# Patient Record
Sex: Male | Born: 2000 | Race: Black or African American | Hispanic: No | Marital: Single | Smoking: Never smoker
Health system: Southern US, Community
[De-identification: ages and names within clinical notes are randomized; demographics above are authoritative.]

---
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
## 2020-04-06 ENCOUNTER — Other Ambulatory Visit: Payer: Medicaid Other

## 2020-04-06 DIAGNOSIS — Z20822 Contact with and (suspected) exposure to covid-19: Secondary | ICD-10-CM

## 2020-04-07 LAB — NOVEL CORONAVIRUS, NAA: SARS-CoV-2, NAA: NOT DETECTED

## 2020-04-07 LAB — SARS-COV-2, NAA 2 DAY TAT

## 2021-06-26 ENCOUNTER — Emergency Department
Admission: EM | Admit: 2021-06-26 | Discharge: 2021-06-27 | Disposition: A | Discharge status: Home / Self Care | Payer: BLUE CROSS/BLUE SHIELD | Attending: Emergency Medicine | Admitting: Emergency Medicine

## 2021-06-26 ENCOUNTER — Emergency Department: Payer: BLUE CROSS/BLUE SHIELD

## 2021-06-26 ENCOUNTER — Other Ambulatory Visit: Payer: Self-pay

## 2021-06-26 DIAGNOSIS — R55 Syncope and collapse: Secondary | ICD-10-CM | POA: Diagnosis not present

## 2021-06-26 DIAGNOSIS — U071 COVID-19: Secondary | ICD-10-CM | POA: Diagnosis not present

## 2021-06-26 DIAGNOSIS — J029 Acute pharyngitis, unspecified: Secondary | ICD-10-CM | POA: Diagnosis present

## 2021-06-26 LAB — CBC
HCT: 45.8 % (ref 39.0–52.0)
Hemoglobin: 16.5 g/dL (ref 13.0–17.0)
MCH: 29.9 pg (ref 26.0–34.0)
MCHC: 36 g/dL (ref 30.0–36.0)
MCV: 83 fL (ref 80.0–100.0)
Platelets: 252 10*3/uL (ref 150–400)
RBC: 5.52 MIL/uL (ref 4.22–5.81)
RDW: 12.2 % (ref 11.5–15.5)
WBC: 7.8 10*3/uL (ref 4.0–10.5)
nRBC: 0 % (ref 0.0–0.2)

## 2021-06-26 LAB — BASIC METABOLIC PANEL
Anion gap: 11 (ref 5–15)
BUN: 14 mg/dL (ref 6–20)
CO2: 21 mmol/L — ABNORMAL LOW (ref 22–32)
Calcium: 9.2 mg/dL (ref 8.9–10.3)
Chloride: 104 mmol/L (ref 98–111)
Creatinine, Ser: 1.05 mg/dL (ref 0.61–1.24)
GFR, Estimated: >60 mL/min (ref >60)
Glucose, Bld: 116 mg/dL — ABNORMAL HIGH (ref 70–99)
Potassium: 3.9 mmol/L (ref 3.5–5.1)
Sodium: 136 mmol/L (ref 135–145)

## 2021-06-26 LAB — URINALYSIS, ROUTINE W REFLEX MICROSCOPIC
Bilirubin Urine: NEGATIVE
Glucose, UA: NEGATIVE mg/dL
Hgb urine dipstick: NEGATIVE
Ketones, ur: NEGATIVE mg/dL
Leukocytes,Ua: NEGATIVE
Nitrite: NEGATIVE
Protein, ur: NEGATIVE mg/dL
Specific Gravity, Urine: 1.008 (ref 1.005–1.030)
pH: 8 (ref 5.0–8.0)

## 2021-06-26 LAB — RESP PANEL BY RT-PCR (FLU A&B, COVID) ARPGX2
Influenza A by PCR: NEGATIVE
Influenza B by PCR: NEGATIVE
SARS Coronavirus 2 by RT PCR: POSITIVE — AB

## 2021-06-26 LAB — TROPONIN I (HIGH SENSITIVITY)
Troponin I (High Sensitivity): 5 ng/L (ref <18)
Troponin I (High Sensitivity): 6 ng/L (ref <18)

## 2021-06-26 MED ORDER — ONDANSETRON HCL 4 MG/2ML IJ SOLN
4.0000 mg | Freq: Once | INTRAMUSCULAR | Status: AC
  Administered 2021-06-26: 4 mg via INTRAVENOUS
  Filled 2021-06-26: qty 2

## 2021-06-26 MED ORDER — KETOROLAC TROMETHAMINE 30 MG/ML IJ SOLN
15.0000 mg | Freq: Once | INTRAMUSCULAR | Status: AC
  Administered 2021-06-26: 15 mg via INTRAVENOUS
  Filled 2021-06-26: qty 1

## 2021-06-26 MED ORDER — ACETAMINOPHEN 500 MG PO TABS
1000.0000 mg | ORAL_TABLET | Freq: Once | ORAL | Status: AC
  Administered 2021-06-26: 1000 mg via ORAL
  Filled 2021-06-26: qty 2

## 2021-06-26 MED ORDER — SODIUM CHLORIDE 0.9 % IV BOLUS
1000.0000 mL | Freq: Once | INTRAVENOUS | Status: AC
  Administered 2021-06-26: 1000 mL via INTRAVENOUS

## 2021-06-26 MED ORDER — NIRMATRELVIR/RITONAVIR (PAXLOVID)TABLET
3.0000 | ORAL_TABLET | Freq: Two times a day (BID) | ORAL | 0 refills | Status: AC
Start: 2021-06-26 — End: 2021-07-01

## 2021-06-26 NOTE — ED Provider Notes (Signed)
[facility] Provider Note    Event Date/Time   First MD Initiated Contact with Patient 06/26/21 1957     (approximate)  History   Chief Complaint: Loss of Consciousness  HPI  Dennis Hodges is a 21 y.o. male with no significant past medical history who presents to the emergency department after syncopal episode.  According to the patient and his mother the patient was performing at a church and in between sets states felt lightheaded and was found on the ground unconscious.  Patient does state he has had a sore throat and runny nose that started this morning.  No known fever.  99.5 in the emergency department.  Currently patient is feeling chills, headache.  Patient has a stuttering speech pattern which mom states is not normal.  Physical Exam   Triage Vital Signs: ED Triage Vitals  Enc Vitals Group     BP 06/26/21 1936 125/77     Pulse Rate 06/26/21 1936 (!) 110     Resp 06/26/21 1936 (!) 22     Temp 06/26/21 1936 99.5 F (37.5 C)     Temp Source 06/26/21 1936 Axillary     SpO2 06/26/21 1936 94 %     Weight 06/26/21 1937 250 lb (113.4 kg)     Height 06/26/21 1937 5\' 9"  (1.753 m)     Head Circumference --      Peak Flow --      Pain Score 06/26/21 1937 0     Pain Loc --      Pain Edu? --      Excl. in GC? --     Most recent vital signs: Vitals:   06/26/21 1936  BP: 125/77  Pulse: (!) 110  Resp: (!) 22  Temp: 99.5 F (37.5 C)  SpO2: 94%    General: Awake, no distress.  CV:  Good peripheral perfusion.  Regular rate and rhythm  Resp:  Normal effort.  Equal breath sounds bilaterally.  No wheeze rales or rhonchi. Abd:  No distention.  Soft, nontender.  No rebound or guarding. Other:  Somewhat of a stuttering speech pattern.  Scleral injection bilaterally.   ED Results / Procedures / Treatments   EKG  EKG viewed and interpreted by myself shows sinus tachycardia 112 bpm with a narrow QRS, normal axis, normal intervals, no concerning ST  changes.  RADIOLOGY  I have reviewed the CT images, no acute abnormality my evaluation. CT is read as negative.   MEDICATIONS ORDERED IN ED: Medications - No data to display   IMPRESSION / MDM / ASSESSMENT AND PLAN / ED COURSE  I reviewed the triage vital signs and the nursing notes.  Patient presents to the emergency department after syncopal episode at church.  Patient does have somewhat of a stuttering speech pattern which mom states is atypical.  He also is complaining of a sore throat runny nose since this morning, will 99.5 temp in the emergency department somewhat tachycardic.  Concern for infectious illness such as COVID or influenza we will check a swab.  We will check labs given the syncopal episode and headache we will obtain CT imaging of the head to rule out intracranial hemorrhage or other abnormality.  We will IV hydrate, treat with IV Toradol while awaiting results.  Patient's labs have resulted showing he is COVID-positive this is likely the cause of the patient's syncopal episode.  Patient's lab work is otherwise reassuring with a normal chemistry, normal CBC.  Troponin is negative.  CT scan head is negative.  Given the patient's significant symptoms of his COVID illness I did consider admitting to the hospital however given his age reassuring vitals and reassuring labs otherwise I believe he would be safe for discharge home.  We will place on Paxlovid and have the patient follow-up with his doctor.  Patient agreeable to plan.  FINAL CLINICAL IMPRESSION(S) / ED DIAGNOSES   Syncope COVID-19 Rx / DC Orders   Paxlovid  Note:  This document was prepared using Dragon voice recognition software and may include unintentional dictation errors.   Minna Antis, MD 06/26/21 2333

## 2021-06-26 NOTE — ED Triage Notes (Signed)
Pt with syncopal episode at church. Pt denies pain, states does have a sore throat and runny nose that began this am.

## 2023-09-05 IMAGING — CT CT HEAD W/O CM
4 series · 17 of 47 positions shown, 19 images · non-contrast
Comparison: None.

CLINICAL DATA: Syncope



[Series 2: head wo · axial · 0.43mm/px · z∈[-140,-10]mm · 7 of 36 slices shown, 9 images]
[im 5/36  brain]
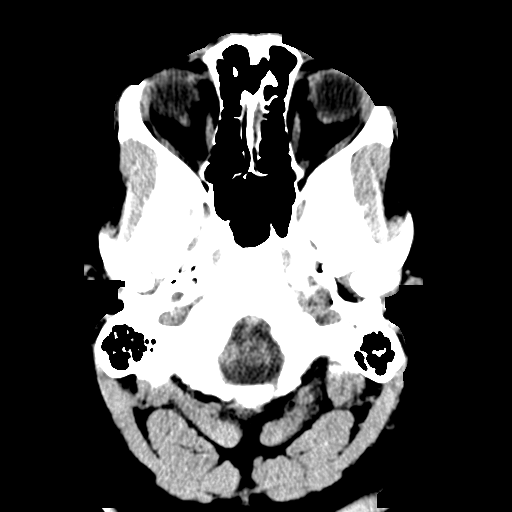
[im 5/36  bone]
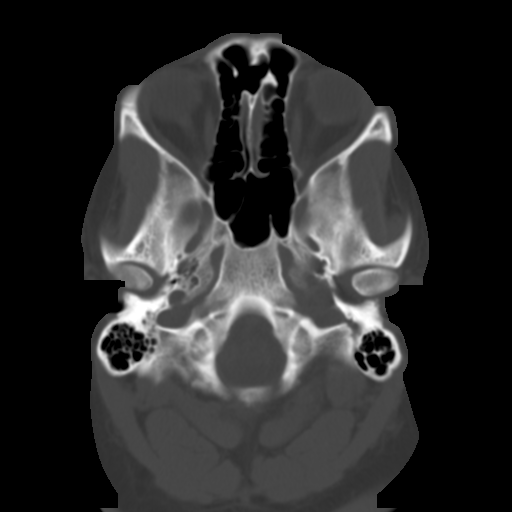
[im 9/36  brain]
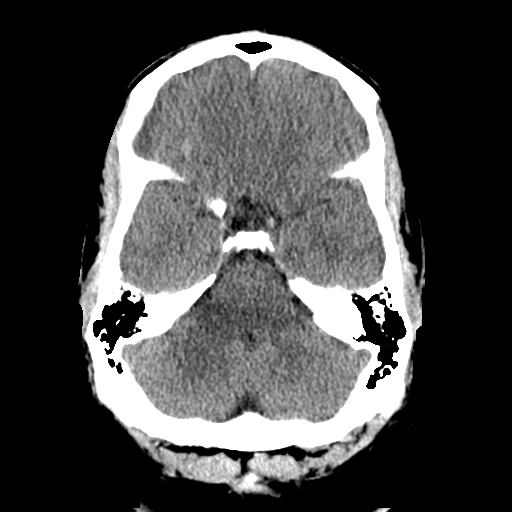
[im 14/36  brain]
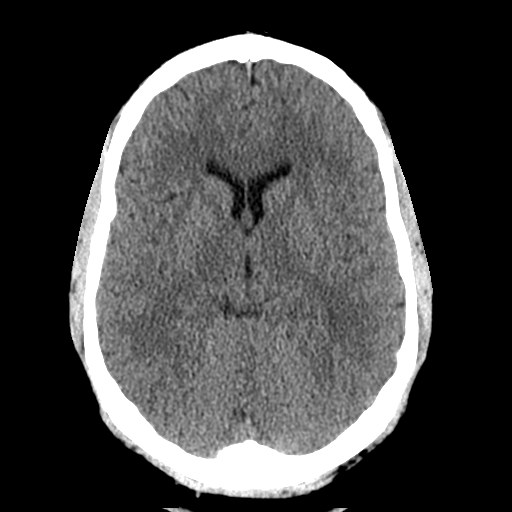
[im 18/36  brain]
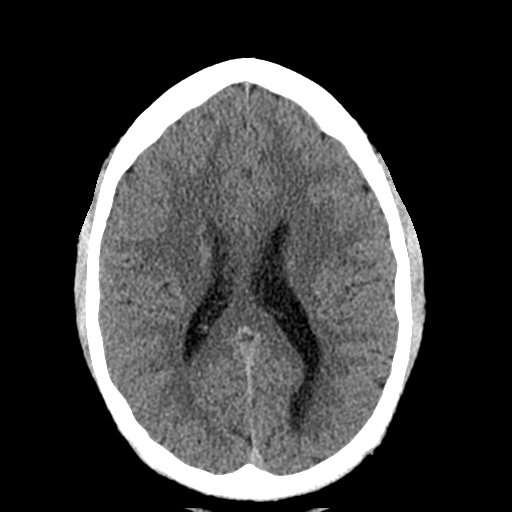
[im 22/36  brain]
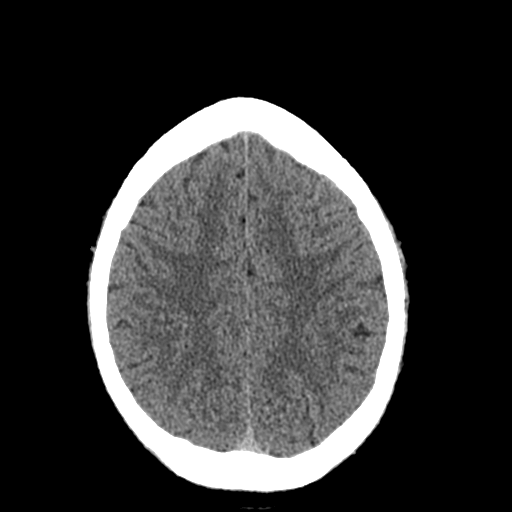
[im 22/36  bone]
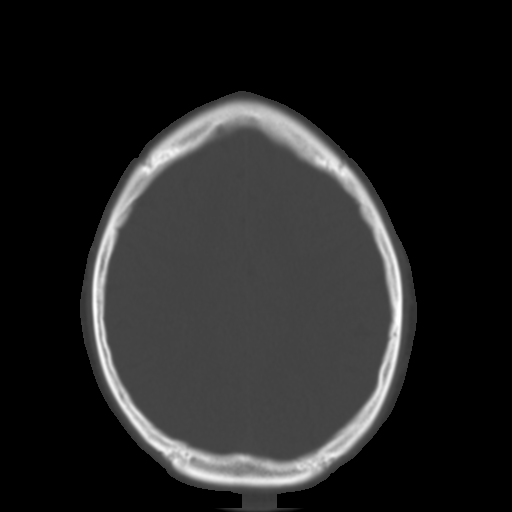
[im 27/36  brain]
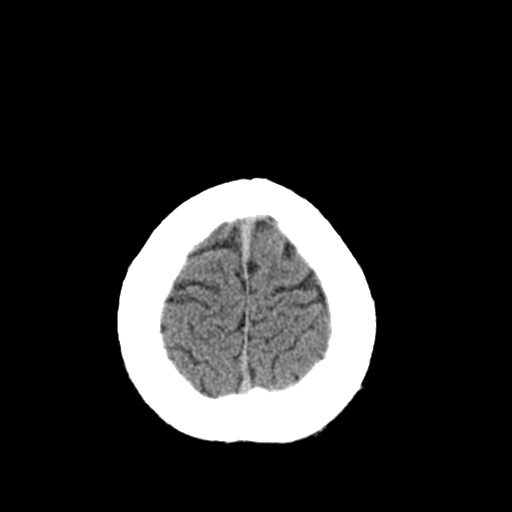
[im 31/36  brain]
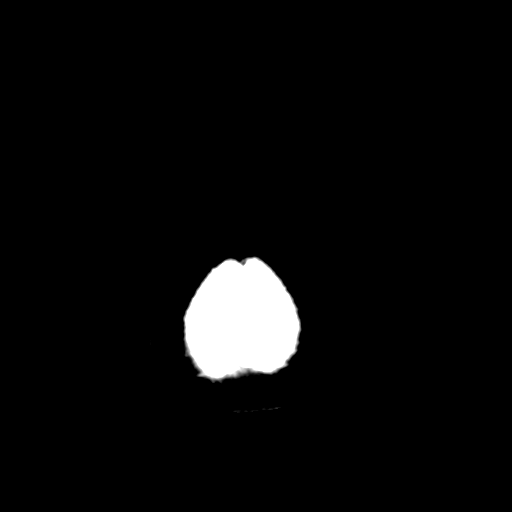

[Series 3: head bone · axial · 0.43mm/px · z∈[-144,-80]mm · 4 of 90 slices shown]
[im 9/90  bone]
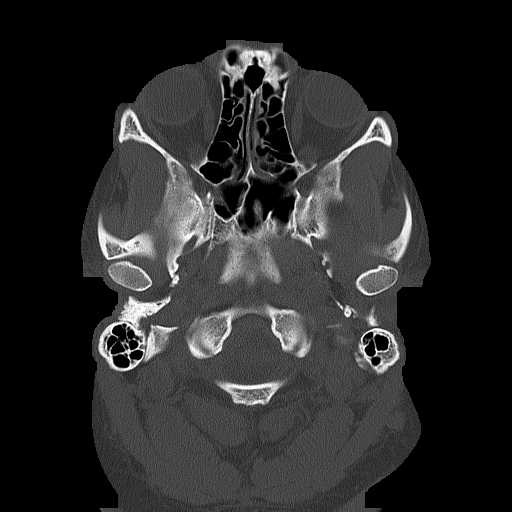
[im 18/90  bone]
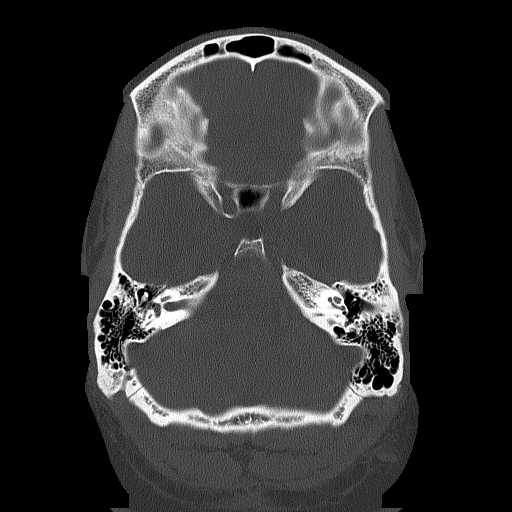
[im 27/90  bone]
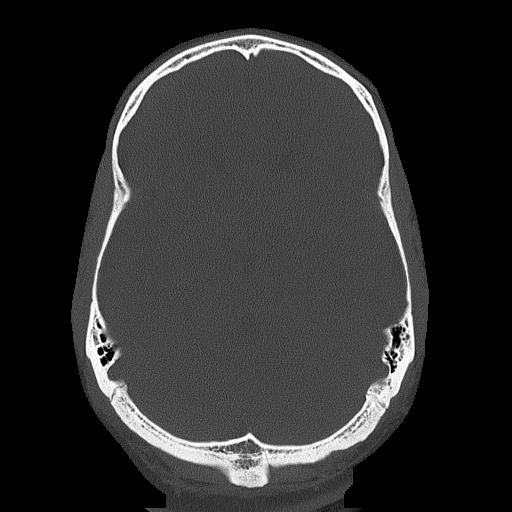
[im 41/90  bone]
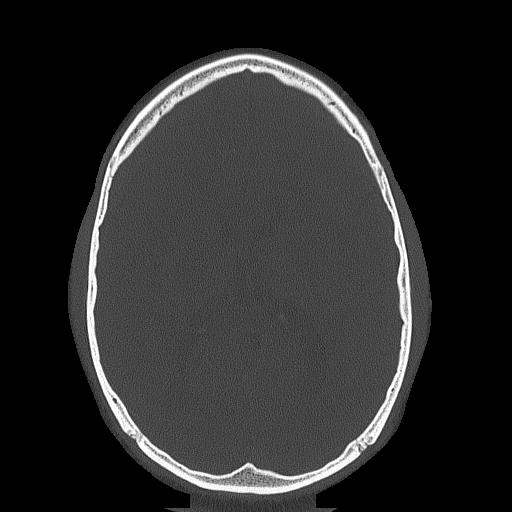

[Series 4: coronal soft tissue · coronal · 0.37mm/px · 3 of 82 slices shown]
[im 28/82  brain]
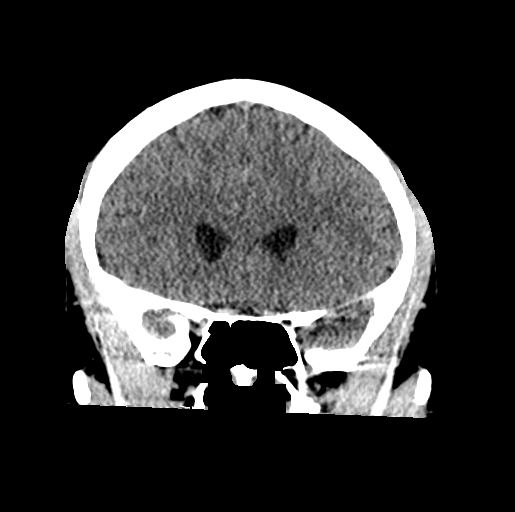
[im 37/82  brain]
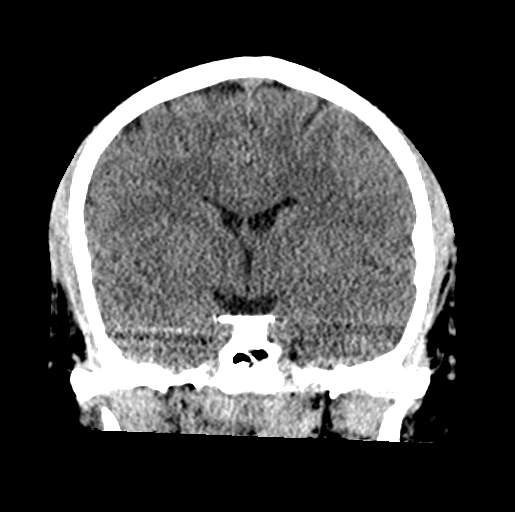
[im 46/82  brain]
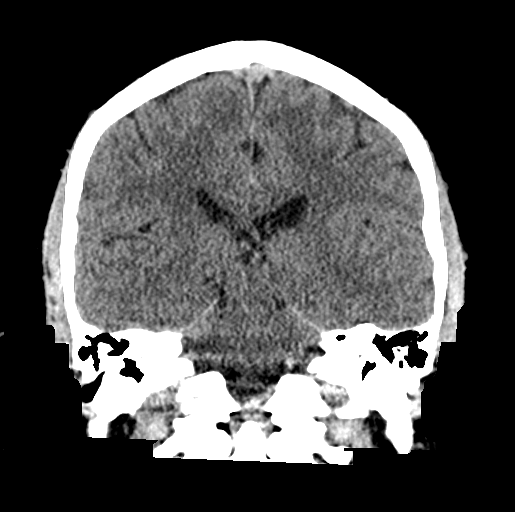

[Series 5: sagittal soft tissue · sagittal · 0.41mm/px · 3 of 64 slices shown]
[im 22/64  brain]
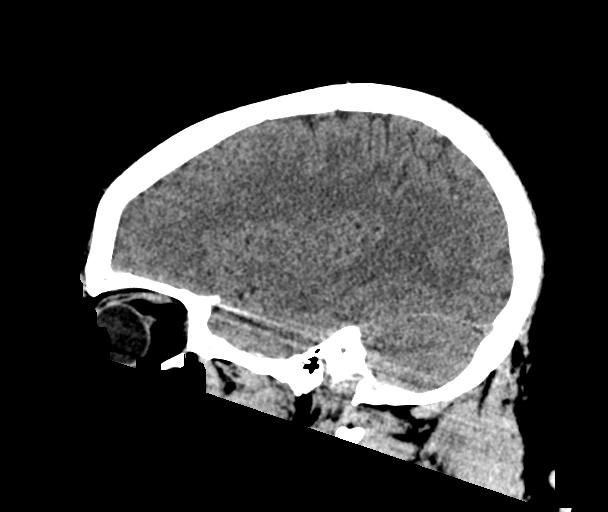
[im 32/64  brain]
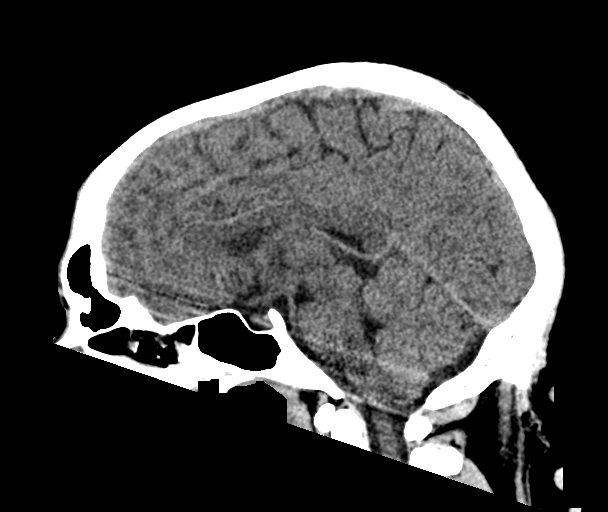
[im 43/64  brain]
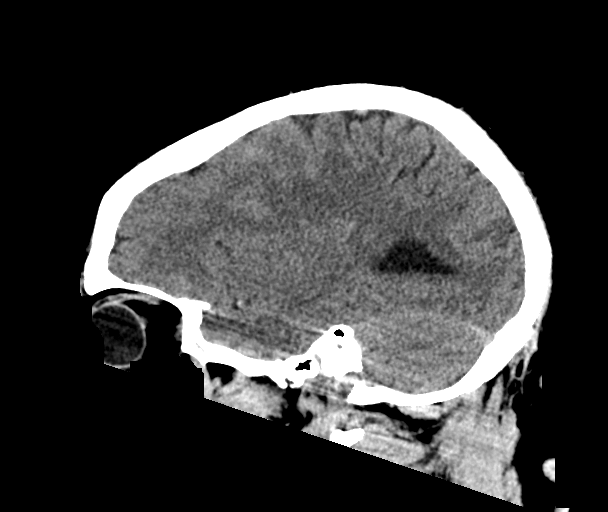

[17 of 47 positions shown; findings below may reference images not displayed]

FINDINGS: Brain: No evidence of acute infarction, hemorrhage, hydrocephalus,
extra-axial collection or mass lesion/mass effect.

Vascular: No hyperdense vessel or unexpected calcification.

Skull: Normal. Negative for fracture or focal lesion.

Sinuses/Orbits: The visualized paranasal sinuses are essentially
clear. The mastoid air cells are unopacified.

Other: None.
IMPRESSION: Normal head CT.
